# Patient Record
Sex: Female | Born: 1995 | Race: White | Hispanic: No | Marital: Single
Health system: Southern US, Community
[De-identification: ages and names within clinical notes are randomized; demographics above are authoritative.]

---

## 2020-11-14 ENCOUNTER — Ambulatory Visit: Payer: Self-pay | Admitting: Medical-Surgical

## 2021-05-18 ENCOUNTER — Emergency Department (HOSPITAL_COMMUNITY)
Admission: EM | Admit: 2021-05-18 | Discharge: 2021-05-19 | Disposition: A | Discharge status: Home / Self Care | Payer: Self-pay | Attending: Emergency Medicine | Admitting: Emergency Medicine

## 2021-05-18 DIAGNOSIS — R002 Palpitations: Secondary | ICD-10-CM | POA: Insufficient documentation

## 2021-05-18 DIAGNOSIS — R1031 Right lower quadrant pain: Secondary | ICD-10-CM | POA: Insufficient documentation

## 2021-05-18 NOTE — ED Provider Notes (Signed)
Emergency Medicine Provider Triage Evaluation Note  Melody Martinez , a 25 y.o. female  was evaluated in triage.  Pt complains of progressively worsening right lower quadrant pain that is intermittent and severe pressure type pain since October of last year.  She was seen in urgent care once in May for evaluation but Is otherwise not been evaluated by medical provider.  She endorses nausea when the pain is more severe but denies vomiting.  Endorses some loose stools but has history of IBS.  Endorses regular menses with LMP 04/19/2021.  She is not sexually active x2 years.  She denies any vaginal bleeding or discharge.  Abdominal pain is improved with heat application and ibuprofen, pain is significantly worse immediately following urination or bowel movement. Additionally patient has history of palpitations that come and go and are "quite uncomfortable".  Additionally states that these palpitations occur both at rest and with activity and it "feels my heart is skipping a beat".  Review of Systems  Positive: Palpitations, right lower quadrant abdominal pain, bloating, nausea. Negative: Chest pain, shortness of breath, fevers, chills, diarrhea, vomiting, vaginal bleeding, discharge  Physical Exam  There were no vitals taken for this visit. Gen:   Awake, no distress   Resp:  Normal effort  MSK:   Moves extremities without difficulty  Other:  RLQ TTP without guarding or rebound.  Tachycardia with regular rhythm, no M/R/G.  Lungs CTA B.  Patient is well-appearing.  Medical Decision Making  Medically screening exam initiated at 11:44 PM.  Appropriate orders placed.  Deniss Wormley was informed that the remainder of the evaluation will be completed by another provider, this initial triage assessment does not replace that evaluation, and the importance of remaining in the ED until their evaluation is complete.  This chart was dictated using voice recognition software, Dragon. Despite the best efforts of  this provider to proofread and correct errors, errors may still occur which can change documentation meaning.    Sherrilee Gilles 05/19/21 0008    Koleen Distance, MD 05/19/21 1218

## 2021-05-19 ENCOUNTER — Encounter (HOSPITAL_COMMUNITY): Payer: Self-pay | Admitting: *Deleted

## 2021-05-19 ENCOUNTER — Emergency Department (HOSPITAL_COMMUNITY): Payer: Self-pay

## 2021-05-19 ENCOUNTER — Other Ambulatory Visit: Payer: Self-pay

## 2021-05-19 LAB — URINALYSIS, ROUTINE W REFLEX MICROSCOPIC
Bilirubin Urine: NEGATIVE
Glucose, UA: NEGATIVE mg/dL
Hgb urine dipstick: NEGATIVE
Ketones, ur: NEGATIVE mg/dL
Leukocytes,Ua: NEGATIVE
Nitrite: NEGATIVE
Protein, ur: NEGATIVE mg/dL
Specific Gravity, Urine: 1.013 (ref 1.005–1.030)
pH: 5 (ref 5.0–8.0)

## 2021-05-19 LAB — CBC WITH DIFFERENTIAL/PLATELET
Abs Immature Granulocytes: 0.04 10*3/uL (ref 0.00–0.07)
Basophils Absolute: 0.1 10*3/uL (ref 0.0–0.1)
Basophils Relative: 0 %
Eosinophils Absolute: 0.1 10*3/uL (ref 0.0–0.5)
Eosinophils Relative: 1 %
HCT: 39.8 % (ref 36.0–46.0)
Hemoglobin: 12.9 g/dL (ref 12.0–15.0)
Immature Granulocytes: 0 %
Lymphocytes Relative: 17 %
Lymphs Abs: 2.1 10*3/uL (ref 0.7–4.0)
MCH: 27.7 pg (ref 26.0–34.0)
MCHC: 32.4 g/dL (ref 30.0–36.0)
MCV: 85.6 fL (ref 80.0–100.0)
Monocytes Absolute: 1.1 10*3/uL — ABNORMAL HIGH (ref 0.1–1.0)
Monocytes Relative: 9 %
Neutro Abs: 9.1 10*3/uL — ABNORMAL HIGH (ref 1.7–7.7)
Neutrophils Relative %: 73 %
Platelets: 329 10*3/uL (ref 150–400)
RBC: 4.65 MIL/uL (ref 3.87–5.11)
RDW: 13.2 % (ref 11.5–15.5)
WBC: 12.6 10*3/uL — ABNORMAL HIGH (ref 4.0–10.5)
nRBC: 0 % (ref 0.0–0.2)

## 2021-05-19 LAB — TROPONIN I (HIGH SENSITIVITY)
Troponin I (High Sensitivity): 3 ng/L (ref <18)
Troponin I (High Sensitivity): 4 ng/L (ref <18)

## 2021-05-19 LAB — COMPREHENSIVE METABOLIC PANEL
ALT: 14 U/L (ref 0–44)
AST: 19 U/L (ref 15–41)
Albumin: 4.2 g/dL (ref 3.5–5.0)
Alkaline Phosphatase: 59 U/L (ref 38–126)
Anion gap: 5 (ref 5–15)
BUN: 6 mg/dL (ref 6–20)
CO2: 25 mmol/L (ref 22–32)
Calcium: 9.5 mg/dL (ref 8.9–10.3)
Chloride: 108 mmol/L (ref 98–111)
Creatinine, Ser: 0.72 mg/dL (ref 0.44–1.00)
GFR, Estimated: >60 mL/min (ref >60)
Glucose, Bld: 126 mg/dL — ABNORMAL HIGH (ref 70–99)
Potassium: 3.7 mmol/L (ref 3.5–5.1)
Sodium: 138 mmol/L (ref 135–145)
Total Bilirubin: 1.5 mg/dL — ABNORMAL HIGH (ref 0.3–1.2)
Total Protein: 7.2 g/dL (ref 6.5–8.1)

## 2021-05-19 LAB — LIPASE, BLOOD: Lipase: 27 U/L (ref 11–51)

## 2021-05-19 LAB — PREGNANCY, URINE: Preg Test, Ur: NEGATIVE

## 2021-05-19 LAB — MAGNESIUM: Magnesium: 1.9 mg/dL (ref 1.7–2.4)

## 2021-05-19 LAB — TSH: TSH: 3.922 u[IU]/mL (ref 0.350–4.500)

## 2021-05-19 MED ORDER — IBUPROFEN 400 MG PO TABS
600.0000 mg | ORAL_TABLET | Freq: Once | ORAL | Status: AC
  Administered 2021-05-19: 600 mg via ORAL
  Filled 2021-05-19: qty 1

## 2021-05-19 NOTE — Discharge Instructions (Addendum)
Please establish care with primary care. You may benefit from a holter monitor for your palpitations.  Call to schedule an appointment with gynecology for evaluation of your intermittent abdominal pain. Return to the ED for significantly worsening symptoms, or if you develop chest pains or loss of consciousness with your palpitations.

## 2021-05-19 NOTE — ED Triage Notes (Signed)
The pt is c/o rt abf pain for 6 weeks  lmp ,ay 20th

## 2021-05-19 NOTE — ED Notes (Signed)
Patient verbalizes understanding of discharge instructions. Opportunity for questioning and answers were provided. Armband removed by staff, pt discharged from ED and ambulated to lobby to return home.   

## 2021-05-19 NOTE — ED Provider Notes (Signed)
[facility] EMERGENCY DEPARTMENT Provider Note   CSN: [id number] Arrival date & time: 05/18/21  2229     History Chief Complaint  Patient presents with   Abdominal Pain    Melody Martinez is a 25 y.o. female past medical history of IBS, presenting for evaluation of chronic intermittent right lower quadrant abdominal pain. RLQ abdominal pain started 16mo ago, is described as discomfort and has escalated over the last few months. Pain comes and goes, worse about 1 week after menstrual period ends. Described as pressure type sensation, very dull, applying pressure relieves pain. Feels better when bladder is full, worse after she empties her bladder. Only lasts few hours at a time. No new vaginal d/c or bleeding. No fever/chills, n/v, changes in BM. Not sexually active within the last 2 years and has had a pap since then.   Also reports intermittent episodes of palpitations. She described as a brief feeling of a skipped beat that occurs with both activity and rest. No assoc CP or SOB.   She is new to the area, has not established PCP or GYN.  Reports family hx endometriosis.  The history is provided by the patient.      History reviewed. No pertinent past medical history.  There are no problems to display for this patient.   History reviewed. No pertinent surgical history.   OB History   No obstetric history on file.     No family history on file.  Social History   Substance Use Topics   Alcohol use: Never    Home Medications Prior to Admission medications   Not on File    Allergies    Patient has no allergy information on record.  Review of Systems   Review of Systems  All other systems reviewed and are negative.  Physical Exam Updated Vital Signs BP 113/83   Pulse 79   Temp 98.6 F (37 C) (Oral)   Resp 18   Ht 5\' 7"  (1.702 m)   Wt 83.9 kg   LMP 04/23/2021   SpO2 99%   BMI 28.98 kg/m   Physical Exam Vitals and nursing note reviewed.   Constitutional:      Appearance: She is well-developed.  HENT:     Head: Normocephalic and atraumatic.  Eyes:     Conjunctiva/sclera: Conjunctivae normal.  Cardiovascular:     Rate and Rhythm: Normal rate and regular rhythm.     Pulses: Normal pulses.  Pulmonary:     Effort: Pulmonary effort is normal. No respiratory distress.     Breath sounds: Normal breath sounds.  Abdominal:     General: Abdomen is flat. Bowel sounds are normal.     Palpations: Abdomen is soft.     Tenderness: There is no abdominal tenderness.  Skin:    General: Skin is warm.  Neurological:     Mental Status: She is alert.  Psychiatric:        Behavior: Behavior normal.    ED Results / Procedures / Treatments   Labs (all labs ordered are listed, but only abnormal results are displayed) Labs Reviewed  COMPREHENSIVE METABOLIC PANEL - Abnormal; Notable for the following components:      Result Value   Glucose, Bld 126 (*)    Total Bilirubin 1.5 (*)    All other components within normal limits  CBC WITH DIFFERENTIAL/PLATELET - Abnormal; Notable for the following components:   WBC 12.6 (*)    Neutro Abs 9.1 (*)  Monocytes Absolute 1.1 (*)    All other components within normal limits  URINALYSIS, ROUTINE W REFLEX MICROSCOPIC - Abnormal; Notable for the following components:   APPearance HAZY (*)    All other components within normal limits  LIPASE, BLOOD  MAGNESIUM  TSH  PREGNANCY, URINE  TROPONIN I (HIGH SENSITIVITY)  TROPONIN I (HIGH SENSITIVITY)    EKG EKG Interpretation  Date/Time:  Sunday May 19 2021 07:54:46 EDT Ventricular Rate:  67 PR Interval:  157 QRS Duration: 125 QT Interval:  412 QTC Calculation: 435 R Axis:   79 Text Interpretation: Sinus rhythm Right bundle branch block ST elev, probable normal early repol pattern No old tracing to compare Confirmed by Linwood Dibbles [phone]) on 05/19/2021 8:02:02 AM  Radiology DG Chest 2 View  Result Date: 05/19/2021 CLINICAL DATA:   Palpitations, chest tightness, dizziness EXAM: CHEST - 2 VIEW COMPARISON:  None. FINDINGS: The heart size and mediastinal contours are within normal limits. Both lungs are clear. The visualized skeletal structures are unremarkable. IMPRESSION: No active cardiopulmonary disease. Electronically Signed   By: Sharlet Salina M.D.   On: 05/19/2021 00:59    Procedures Procedures   Medications Ordered in ED Medications  ibuprofen (ADVIL) tablet 600 mg (600 mg Oral Given 05/19/21 0227)    ED Course  I have reviewed the triage vital signs and the nursing notes.  Pertinent labs & imaging results that were available during my care of the patient were reviewed by me and considered in my medical decision making (see chart for details).    MDM Rules/Calculators/A&P                          Patient is a 25 year old female with history of IBS, presenting for 6 months of recurrent right lower quadrant abdominal pain about 1 week after the last day of her menstrual periods.  Her LMP is May 20.  Has not been sexually active in the last 2 years and had Pap smear since that time.  Pain comes and goes, is more of a dull pressure-like pain.  No changes in bowel movements, no urinary symptoms.  Offered pelvic exam, however patient declined.  Abdomen is soft and nontender.  Blood work is overall reassuring, there is slight leukocytosis of 12.  Patient's presentation is not consistent with acute abdomen.  Consider ovarian cyst versus mittelschmerz.   She also complains of intermittent palpitations, described as brief feeling that her heart skips a beat.  This occurs both with activity and at rest.  She has no associated chest pain or shortness of breath.  Was seen by PCP 3 years ago for this, she states they did an EKG which was normal though no other work-up.  EKG today shows findings consistent with right bundle branch block.  No prior EKG for comparison.  TSH is within normal limits.  Troponins were also ordered in  triage and are negative.  X-ray is clear.  Provided with PCP referral to establish care.  May benefit from Holter monitor.  Discussed importance of outpatient follow-up.  She is provided with PCP and GYN referral.  Strict return precautions discussed.  She is in agreement with care plan, discharged in no distress  Final Clinical Impression(s) / ED Diagnoses Final diagnoses:  Palpitation  Intermittent right lower quadrant abdominal pain    Rx / DC Orders ED Discharge Orders     None        Graham Doukas, [place] N, PA-C 05/19/21  1324    Linwood Dibbles, MD 05/21/21 [phone]

## 2022-06-03 IMAGING — CR DG CHEST 2V
2 series · 2 of 2 positions shown · non-contrast
Comparison: None.

CLINICAL DATA: Palpitations, chest tightness, dizziness

EXAM:
CHEST - 2 VIEW

[chest pa]
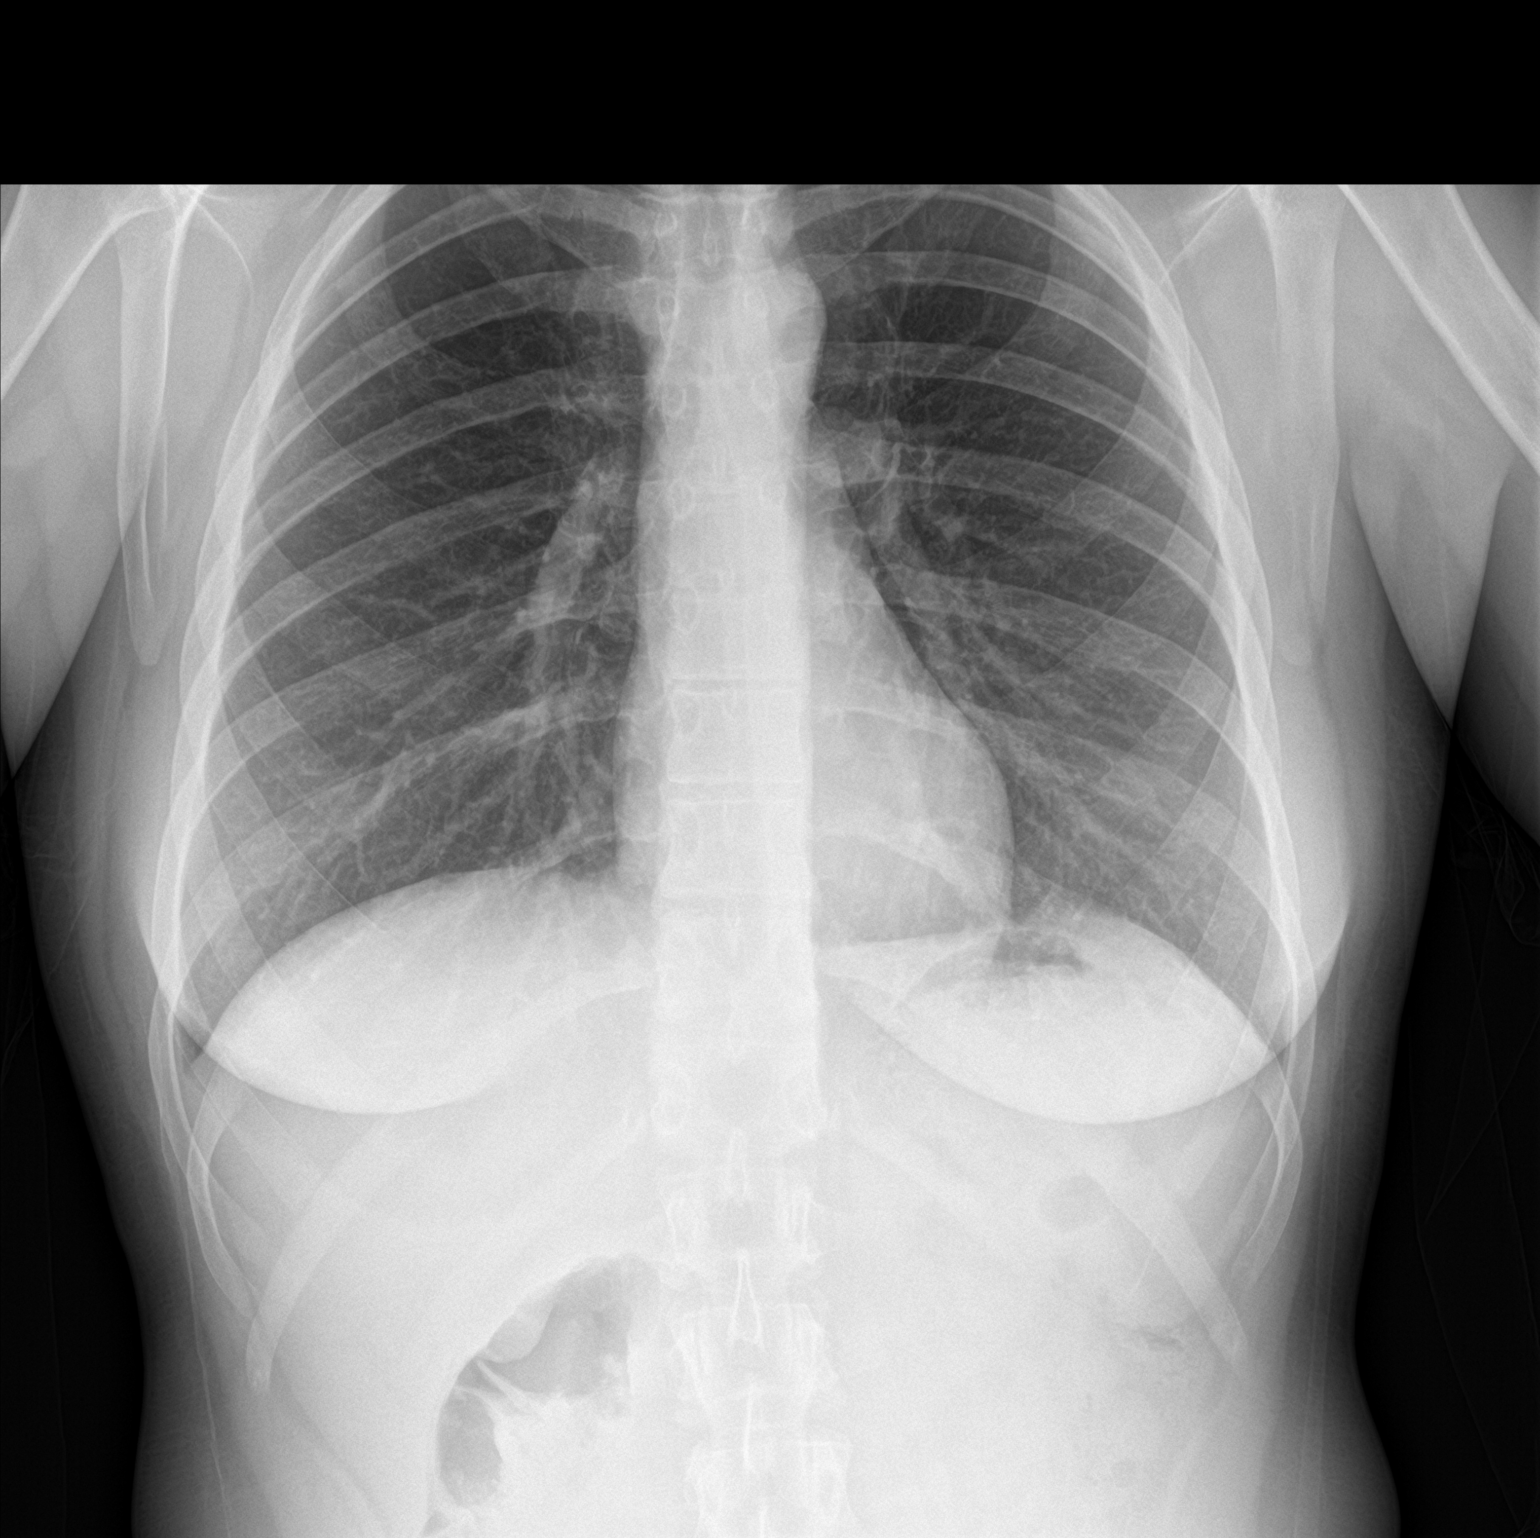

[chest lat]
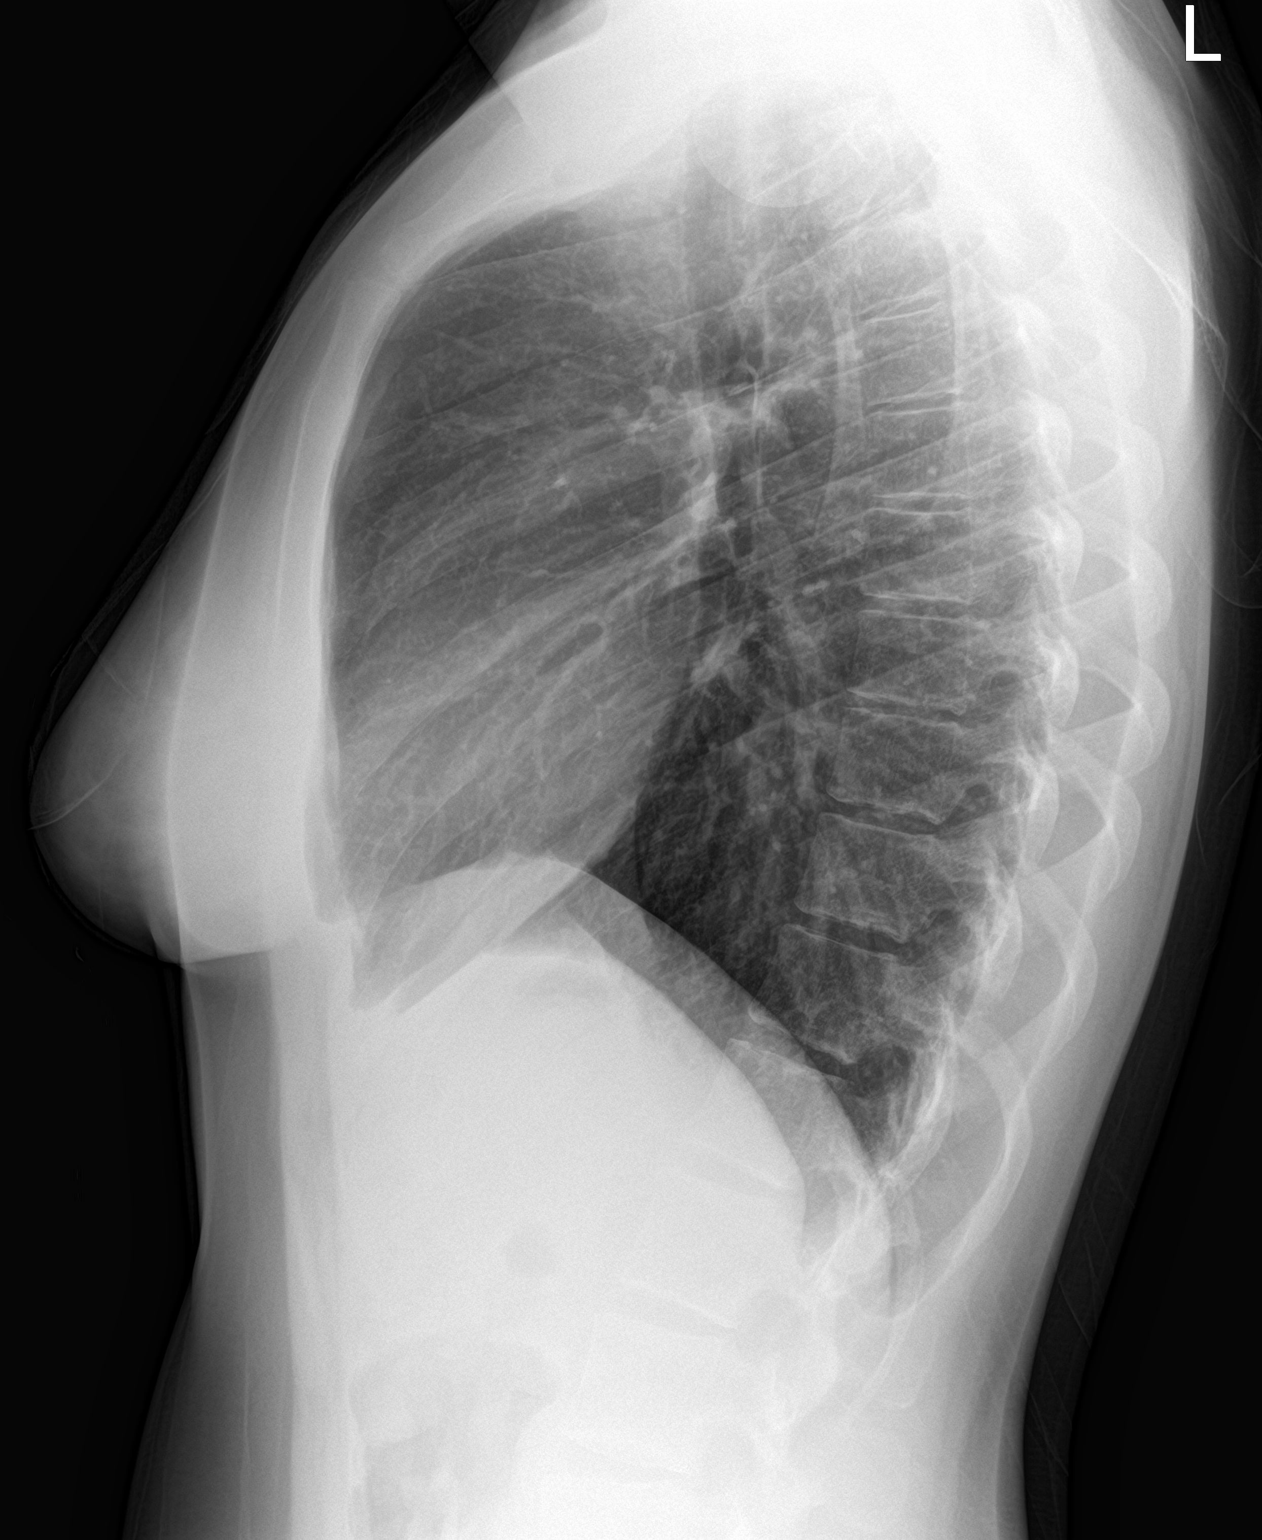

[2 of 2 positions shown; findings below may reference images not displayed]

FINDINGS: The heart size and mediastinal contours are within normal limits.
Both lungs are clear. The visualized skeletal structures are
unremarkable.
IMPRESSION: No active cardiopulmonary disease.
